# Patient Record
Sex: Female | Born: 1990 | Race: Black or African American | Hispanic: No | Marital: Single | Smoking: Former smoker
Health system: Southern US, Community
[De-identification: ages and names within clinical notes are randomized; demographics above are authoritative.]

## PROBLEM LIST (undated history)

## (undated) DIAGNOSIS — Z789 Other specified health status: Secondary | ICD-10-CM

## (undated) HISTORY — PX: NO PAST SURGERIES: SHX2092

---

## 2015-01-17 ENCOUNTER — Emergency Department (HOSPITAL_COMMUNITY): Payer: No Typology Code available for payment source

## 2015-01-17 ENCOUNTER — Encounter (HOSPITAL_COMMUNITY): Payer: Self-pay | Admitting: *Deleted

## 2015-01-17 ENCOUNTER — Emergency Department (HOSPITAL_COMMUNITY)
Admission: EM | Admit: 2015-01-17 | Discharge: 2015-01-18 | Disposition: A | Discharge status: Home / Self Care | Payer: No Typology Code available for payment source | Attending: Emergency Medicine | Admitting: Emergency Medicine

## 2015-01-17 DIAGNOSIS — Z8781 Personal history of (healed) traumatic fracture: Secondary | ICD-10-CM

## 2015-01-17 DIAGNOSIS — Y9389 Activity, other specified: Secondary | ICD-10-CM | POA: Insufficient documentation

## 2015-01-17 DIAGNOSIS — S52501A Unspecified fracture of the lower end of right radius, initial encounter for closed fracture: Secondary | ICD-10-CM | POA: Diagnosis not present

## 2015-01-17 DIAGNOSIS — Y998 Other external cause status: Secondary | ICD-10-CM | POA: Insufficient documentation

## 2015-01-17 DIAGNOSIS — Y92410 Unspecified street and highway as the place of occurrence of the external cause: Secondary | ICD-10-CM | POA: Diagnosis not present

## 2015-01-17 DIAGNOSIS — S6991XA Unspecified injury of right wrist, hand and finger(s), initial encounter: Secondary | ICD-10-CM | POA: Diagnosis present

## 2015-01-17 DIAGNOSIS — Z720 Tobacco use: Secondary | ICD-10-CM | POA: Diagnosis not present

## 2015-01-17 MED ORDER — OXYCODONE-ACETAMINOPHEN 5-325 MG PO TABS
2.0000 | ORAL_TABLET | Freq: Once | ORAL | Status: AC
  Administered 2015-01-17: 2 via ORAL
  Filled 2015-01-17: qty 2
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
## 2015-01-18 ENCOUNTER — Emergency Department (HOSPITAL_COMMUNITY): Payer: No Typology Code available for payment source

## 2015-01-18 MED ORDER — METHOCARBAMOL 500 MG PO TABS: 500.0000 mg | ORAL_TABLET | Freq: Two times a day (BID) | ORAL | Status: DC

## 2015-01-18 MED ORDER — HYDROCODONE-ACETAMINOPHEN 5-325 MG PO TABS: 1.0000 | ORAL_TABLET | Freq: Four times a day (QID) | ORAL | Status: DC | PRN

## 2015-01-18 MED ORDER — LORAZEPAM 0.5 MG PO TABS
1.0000 mg | ORAL_TABLET | Freq: Once | ORAL | Status: AC
  Administered 2015-01-18: 1 mg via ORAL
  Filled 2015-01-18: qty 2

## 2015-01-18 MED ORDER — LIDOCAINE HCL 2 % IJ SOLN
15.0000 mL | Freq: Once | INTRAMUSCULAR | Status: AC
  Administered 2015-01-18: 300 mg
  Filled 2015-01-18: qty 20

## 2015-01-18 MED ORDER — LIDOCAINE HCL 2 % IJ SOLN: 20.0000 mL | Freq: Once | INTRAMUSCULAR | Status: DC

## 2015-01-18 NOTE — Discharge Instructions (Signed)
1. Medications: vicodin, robaxin, usual home medications 2. Treatment: rest, drink plenty of fluids, keep splint dry, keep arm elevated, take pain medicine as needed, use ice 3. Follow Up: Please followup with Dr. Mina MarbleWeingold when he is able to see you in the office for discussion of your diagnoses and further evaluation after today's visit; if you do not have a primary care doctor use the resource guide provided to find one; Please return to the ER for worsening symptoms    Cast or Splint Care Casts and splints support injured limbs and keep bones from moving while they heal. It is important to care for your cast or splint at home.  HOME CARE INSTRUCTIONS  Keep the cast or splint uncovered during the drying period. It can take 24 to 48 hours to dry if it is made of plaster. A fiberglass cast will dry in less than 1 hour.  Do not rest the cast on anything harder than a pillow for the first 24 hours.  Do not put weight on your injured limb or apply pressure to the cast until your health care provider gives you permission.  Keep the cast or splint dry. Wet casts or splints can lose their shape and may not support the limb as well. A wet cast that has lost its shape can also create harmful pressure on your skin when it dries. Also, wet skin can become infected.  Cover the cast or splint with a plastic bag when bathing or when out in the rain or snow. If the cast is on the trunk of the body, take sponge baths until the cast is removed.  If your cast does become wet, dry it with a towel or a blow dryer on the cool setting only.  Keep your cast or splint clean. Soiled casts may be wiped with a moistened cloth.  Do not place any hard or soft foreign objects under your cast or splint, such as cotton, toilet paper, lotion, or powder.  Do not try to scratch the skin under the cast with any object. The object could get stuck inside the cast. Also, scratching could lead to an infection. If itching is a  problem, use a blow dryer on a cool setting to relieve discomfort.  Do not trim or cut your cast or remove padding from inside of it.  Exercise all joints next to the injury that are not immobilized by the cast or splint. For example, if you have a long leg cast, exercise the hip joint and toes. If you have an arm cast or splint, exercise the shoulder, elbow, thumb, and fingers.  Elevate your injured arm or leg on 1 or 2 pillows for the first 1 to 3 days to decrease swelling and pain.It is best if you can comfortably elevate your cast so it is higher than your heart. SEEK MEDICAL CARE IF:   Your cast or splint cracks.  Your cast or splint is too tight or too loose.  You have unbearable itching inside the cast.  Your cast becomes wet or develops a soft spot or area.  You have a bad smell coming from inside your cast.  You get an object stuck under your cast.  Your skin around the cast becomes red or raw.  You have new pain or worsening pain after the cast has been applied. SEEK IMMEDIATE MEDICAL CARE IF:   You have fluid leaking through the cast.  You are unable to move your fingers or toes.  You  have discolored (blue or white), cool, painful, or very swollen fingers or toes beyond the cast.  You have tingling or numbness around the injured area.  You have severe pain or pressure under the cast.  You have any difficulty with your breathing or have shortness of breath.  You have chest pain. Document Released: 10/28/2000 Document Revised: 08/21/2013 Document Reviewed: 05/09/2013 [facility] Patient Information 2015 [place], [place]. This information is not intended to replace advice given to you by your health care provider. Make sure you discuss any questions you have with your health care provider.

## 2015-12-17 ENCOUNTER — Emergency Department (HOSPITAL_COMMUNITY): Payer: Federal, State, Local not specified - PPO

## 2015-12-17 ENCOUNTER — Encounter (HOSPITAL_COMMUNITY): Payer: Self-pay | Admitting: *Deleted

## 2015-12-17 ENCOUNTER — Emergency Department (HOSPITAL_COMMUNITY)
Admission: EM | Admit: 2015-12-17 | Discharge: 2015-12-17 | Disposition: A | Discharge status: Home / Self Care | Payer: Federal, State, Local not specified - PPO | Attending: Emergency Medicine | Admitting: Emergency Medicine

## 2015-12-17 DIAGNOSIS — R0789 Other chest pain: Secondary | ICD-10-CM | POA: Insufficient documentation

## 2015-12-17 DIAGNOSIS — Z79899 Other long term (current) drug therapy: Secondary | ICD-10-CM | POA: Diagnosis not present

## 2015-12-17 DIAGNOSIS — Z3202 Encounter for pregnancy test, result negative: Secondary | ICD-10-CM | POA: Insufficient documentation

## 2015-12-17 DIAGNOSIS — R51 Headache: Secondary | ICD-10-CM | POA: Insufficient documentation

## 2015-12-17 DIAGNOSIS — R079 Chest pain, unspecified: Secondary | ICD-10-CM | POA: Diagnosis present

## 2015-12-17 DIAGNOSIS — F17210 Nicotine dependence, cigarettes, uncomplicated: Secondary | ICD-10-CM | POA: Diagnosis not present

## 2015-12-17 DIAGNOSIS — R000 Tachycardia, unspecified: Secondary | ICD-10-CM | POA: Insufficient documentation

## 2015-12-17 LAB — I-STAT CHEM 8, ED
BUN: 5 mg/dL — ABNORMAL LOW (ref 6–20)
Calcium, Ion: 1.22 mmol/L (ref 1.12–1.23)
Chloride: 101 mmol/L (ref 101–111)
Creatinine, Ser: 0.8 mg/dL (ref 0.44–1.00)
Glucose, Bld: 91 mg/dL (ref 65–99)
HEMATOCRIT: 43 % (ref 36.0–46.0)
Hemoglobin: 14.6 g/dL (ref 12.0–15.0)
POTASSIUM: 3.4 mmol/L — AB (ref 3.5–5.1)
Sodium: 139 mmol/L (ref 135–145)
TCO2: 24 mmol/L (ref 0–100)

## 2015-12-17 LAB — I-STAT TROPONIN, ED: TROPONIN I, POC: 0 ng/mL (ref 0.00–0.08)

## 2015-12-17 LAB — I-STAT BETA HCG BLOOD, ED (MC, WL, AP ONLY)

## 2015-12-17 MED ORDER — HYDROCODONE-ACETAMINOPHEN 5-325 MG PO TABS
1.0000 | ORAL_TABLET | Freq: Once | ORAL | Status: AC
  Administered 2015-12-17: 1 via ORAL
  Filled 2015-12-17: qty 1

## 2015-12-17 MED ORDER — NAPROXEN 500 MG PO TABS
500.0000 mg | ORAL_TABLET | Freq: Two times a day (BID) | ORAL | Status: DC
Start: 2015-12-17 — End: 2017-02-11

## 2015-12-17 NOTE — ED Notes (Signed)
Patient transported to X-ray 

## 2015-12-17 NOTE — ED Notes (Signed)
Pt c/o left rib pain x 2-3 days.  Pt denies cough/injury.

## 2015-12-17 NOTE — ED Provider Notes (Signed)
CSN: [id number]     Arrival date & time 12/17/15  1759 History  By signing my name below, I, Gonzella Lex, attest that this documentation has been prepared under the direction and in the presence of [employer], PA-C. Electronically Signed: Gonzella Lex, Scribe. 12/17/2015. 6:40 PM.   Chief Complaint  Patient presents with  . Pain    left ribs   The history is provided by the patient. No language interpreter was used.   HPI Comments: Kaitlyn Conrad is a 25 y.o. female who presents to the Emergency Department complaining of sudden onset, constant, stabbing, sore, 6/10 left-sided chest/rib pain which began two nights ago and woke her from her sleep. She notes that the pain is intermittently worse with coughing, respiration and movement. She also notes associated mild HA. Pt had a bladder infection last week which was resolved by drinking cranberry juice. She reports possible pregnancy but is unsure, and states that she is not on any sort of contraceptive currently. She has not taken anything for her pain. Pt reports that she smokes about seven cigarettes a day. Also reports increased consumption of soda and not very much water. Pt denies nausea, vomiting, cough, fever, numbness, weakness, leg swelling, diaphoresis, and itching and tingling to the site. She also denies hx of HTN, hypercholesteremia, and DM as well as a family hx of MI or PE.   History reviewed. No pertinent past medical history. History reviewed. No pertinent past surgical history. No family history on file. Social History  Substance Use Topics  . Smoking status: Current Every Day Smoker    Types: Cigarettes  . Smokeless tobacco: None  . Alcohol Use: Yes     Comment: ocassionally   OB History    No data available     Review of Systems A complete 10 system review of systems was obtained and all systems are negative except as noted in the HPI and PMH.   Allergies  Review of patient's allergies indicates no  known allergies.  Home Medications   Prior to Admission medications   Medication Sig Start Date End Date Taking? Authorizing Provider  HYDROcodone-acetaminophen (NORCO/VICODIN) 5-325 MG per tablet Take 1-2 tablets by mouth every 6 (six) hours as needed for moderate pain or severe pain. 01/18/15   Hannah Muthersbaugh, PA-C  methocarbamol (ROBAXIN) 500 MG tablet Take 1 tablet (500 mg total) by mouth 2 (two) times daily. 01/18/15   Hannah Muthersbaugh, PA-C  naproxen (NAPROSYN) 500 MG tablet Take 1 tablet (500 mg total) by mouth 2 (two) times daily. 12/17/15   Joycie Peek, PA-C   BP 112/67 mmHg  Pulse 85  Temp(Src) 99.1 F (37.3 C) (Oral)  Resp 18  SpO2 100%  LMP 11/22/2015 Physical Exam  Constitutional: She is oriented to person, place, and time. She appears well-developed and well-nourished. No distress.  HENT:  Head: Normocephalic and atraumatic.  Mouth/Throat: Oropharynx is clear and moist.  Eyes: Conjunctivae are normal. Pupils are equal, round, and reactive to light. Right eye exhibits no discharge. Left eye exhibits no discharge. No scleral icterus.  Neck: Normal range of motion. Neck supple.  Cardiovascular: Regular rhythm and normal heart sounds.   Mild Tachycardia  Pulmonary/Chest: Effort normal and breath sounds normal. No respiratory distress. She has no wheezes. She has no rales.  Pain not reproducible on palpitation  Abdominal: Soft. She exhibits no distension. There is no tenderness.  Musculoskeletal: Normal range of motion. She exhibits no edema or tenderness.  No unilateral leg swelling  or skin changes.  Neurological: She is alert and oriented to person, place, and time.  Cranial Nerves II-XII grossly intact  Skin: Skin is warm and dry. No rash noted.  Psychiatric: She has a normal mood and affect.  Nursing note and vitals reviewed.   ED Course  Procedures  DIAGNOSTIC STUDIES:    Oxygen Saturation is 100% on RA, normal by my interpretation.   COORDINATION OF  CARE:  6:15 PM Will order EKG and chest x ray. Will administer pain medication in the ED. Discussed treatment plan with pt at bedside and pt agreed to plan.   Labs Review Labs Reviewed  I-STAT CHEM 8, ED - Abnormal; Notable for the following:    Potassium 3.4 (*)    BUN 5 (*)    All other components within normal limits  I-STAT TROPOININ, ED  I-STAT BETA HCG BLOOD, ED ([facility], [facility], [facility] ONLY)   Imaging Review No results found. I have personally reviewed and evaluated these images and lab results as part of my medical decision-making.   EKG Interpretation   Date/Time:  Thursday December 17 2015 18:48:35 EST Ventricular Rate:  98 PR Interval:  126 QRS Duration: 78 QT Interval:  328 QTC Calculation: 419 R Axis:   79 Text Interpretation:  Sinus rhythm Right atrial enlargement Sinus rhythm  Artifact Abnormal ekg Confirmed by Gerhard Munch  MD [id number]) on 12/17/2015  7:16:48 PM     Meds given in ED:  Medications  HYDROcodone-acetaminophen (NORCO/VICODIN) 5-325 MG per tablet 1 tablet (1 tablet Oral Given 12/17/15 1854)    Discharge Medication List as of 12/17/2015  7:48 PM    START taking these medications   Details  naproxen (NAPROSYN) 500 MG tablet Take 1 tablet (500 mg total) by mouth 2 (two) times daily., Starting 12/17/2015, Until Discontinued, Print       Filed Vitals:   12/17/15 1807 12/17/15 2004  BP: 102/77 112/67  Pulse: 110 85  Temp: 99.5 F (37.5 C) 99.1 F (37.3 C)  TempSrc: Oral Oral  Resp: 18 18  SpO2:  100%    MDM  Vitals stable -afebrile Pt resting comfortably in ED. discomfort is improving after oral analgesia PE--Lung exam unremarkable. Cardiac auscultation reveals no murmurs rubs or gallops. Grossly Benign Physical Exam Labwork: Troponin negative. EKG reassuring. Labs otherwise noncontributory Imaging: CXR shows No acute abnormality  DDX: Patient with atypical chest pain ongoing constantly for the past 2 days. Clinical picture and exam today not  consistent with ACS/dissection. Heart score 2. No evidence of spontaneous pneumothorax, esophageal rupture or other mediastinitis. Patient is mildly tachycardic on arrival, 98 bpm on EKG, per well's score she is low risk. Without shortness of breath, cough, hypoxia, low suspicion for PE. Suspect mild tachycardia secondary to pain versus dehydration component. No evidence of infection. No evidence of myocarditis, endocarditis, pericarditis.  Will DC with trial of NSAIDs. I discussed all relevant lab findings and imaging results with pt and they verbalized understanding. Discussed f/u with PCP within 48 hrs and return precautions, pt very amenable to plan.   Final diagnoses:  Atypical chest pain    I personally performed the services described in this documentation, which was scribed in my presence. The recorded information has been reviewed and is accurate.    Joycie Peek, PA-C 12/21/15 0945  Gerhard Munch, MD 12/22/15 [phone]

## 2015-12-17 NOTE — Discharge Instructions (Signed)
There does not appear to be an emergent cause her chest pain at this time. Your exam, EKG, chest x-ray and labs were all reassuring. Please follow-up with your doctor next week or use the attached resource guide to help find a Dr. Return to ED for any new or worsening symptoms as we discussed  Nonspecific Chest Pain  Chest pain can be caused by many different conditions. There is always a chance that your pain could be related to something serious, such as a heart attack or a blood clot in your lungs. Chest pain can also be caused by conditions that are not life-threatening. If you have chest pain, it is very important to follow up with your health care provider. CAUSES  Chest pain can be caused by:  Heartburn.  Pneumonia or bronchitis.  Anxiety or stress.  Inflammation around your heart (pericarditis) or lung (pleuritis or pleurisy).  A blood clot in your lung.  A collapsed lung (pneumothorax). It can develop suddenly on its own (spontaneous pneumothorax) or from trauma to the chest.  Shingles infection (varicella-zoster virus).  Heart attack.  Damage to the bones, muscles, and cartilage that make up your chest wall. This can include:  Bruised bones due to injury.  Strained muscles or cartilage due to frequent or repeated coughing or overwork.  Fracture to one or more ribs.  Sore cartilage due to inflammation (costochondritis). RISK FACTORS  Risk factors for chest pain may include:  Activities that increase your risk for trauma or injury to your chest.  Respiratory infections or conditions that cause frequent coughing.  Medical conditions or overeating that can cause heartburn.  Heart disease or family history of heart disease.  Conditions or health behaviors that increase your risk of developing a blood clot.  Having had chicken pox (varicella zoster). SIGNS AND SYMPTOMS Chest pain can feel like:  Burning or tingling on the surface of your chest or deep in your  chest.  Crushing, pressure, aching, or squeezing pain.  Dull or sharp pain that is worse when you move, cough, or take a deep breath.  Pain that is also felt in your back, neck, shoulder, or arm, or pain that spreads to any of these areas. Your chest pain may come and go, or it may stay constant. DIAGNOSIS Lab tests or other studies may be needed to find the cause of your pain. Your health care provider may have you take a test called an ambulatory ECG (electrocardiogram). An ECG records your heartbeat patterns at the time the test is performed. You may also have other tests, such as:  Transthoracic echocardiogram (TTE). During echocardiography, sound waves are used to create a picture of all of the heart structures and to look at how blood flows through your heart.  Transesophageal echocardiogram (TEE).This is a more advanced imaging test that obtains images from inside your body. It allows your health care provider to see your heart in finer detail.  Cardiac monitoring. This allows your health care provider to monitor your heart rate and rhythm in real time.  Holter monitor. This is a portable device that records your heartbeat and can help to diagnose abnormal heartbeats. It allows your health care provider to track your heart activity for several days, if needed.  Stress tests. These can be done through exercise or by taking medicine that makes your heart beat more quickly.  Blood tests.  Imaging tests. TREATMENT  Your treatment depends on what is causing your chest pain. Treatment may include:  Medicines.  These may include: °¨ Acid blockers for heartburn. °¨ Anti-inflammatory medicine. °¨ Pain medicine for inflammatory conditions. °¨ Antibiotic medicine, if an infection is present. °¨ Medicines to dissolve blood clots. °¨ Medicines to treat coronary artery disease. °· Supportive care for conditions that do not require medicines. This may include: °¨ Resting. °¨ Applying heat or cold  packs to injured areas. °¨ Limiting activities until pain decreases. °HOME CARE INSTRUCTIONS °· If you were prescribed an antibiotic medicine, finish it all even if you start to feel better. °· Avoid any activities that bring on chest pain. °· Do not use any tobacco products, including cigarettes, chewing tobacco, or electronic cigarettes. If you need help quitting, ask your health care provider. °· Do not drink alcohol. °· Take medicines only as directed by your health care provider. °· Keep all follow-up visits as directed by your health care provider. This is important. This includes any further testing if your chest pain does not go away. °· If heartburn is the cause for your chest pain, you may be told to keep your head raised (elevated) while sleeping. This reduces the chance that acid will go from your stomach into your esophagus. °· Make lifestyle changes as directed by your health care provider. These may include: °¨ Getting regular exercise. Ask your health care provider to suggest some activities that are safe for you. °¨ Eating a heart-healthy diet. A registered dietitian can help you to learn healthy eating options. °¨ Maintaining a healthy weight. °¨ Managing diabetes, if necessary. °¨ Reducing stress. °SEEK MEDICAL CARE IF: °· Your chest pain does not go away after treatment. °· You have a rash with blisters on your chest. °· You have a fever. °SEEK IMMEDIATE MEDICAL CARE IF:  °· Your chest pain is worse. °· You have an increasing cough, or you cough up blood. °· You have severe abdominal pain. °· You have severe weakness. °· You faint. °· You have chills. °· You have sudden, unexplained chest discomfort. °· You have sudden, unexplained discomfort in your arms, back, neck, or jaw. °· You have shortness of breath at any time. °· You suddenly start to sweat, or your skin gets clammy. °· You feel nauseous or you vomit. °· You suddenly feel light-headed or dizzy. °· Your heart begins to beat quickly, or  it feels like it is skipping beats. °These symptoms may represent a serious problem that is an emergency. Do not wait to see if the symptoms will go away. Get medical help right away. Call your local emergency services (911 in the [place] Do not drive yourself to the hospital. °  °This information is not intended to replace advice given to you by your health care provider. Make sure you discuss any questions you have with your health care provider. °  °Document Released: 08/10/2005 Document Revised: 11/21/2014 Document Reviewed: 06/06/2014 °Elsevier Interactive Patient Education ©2016 [employer] ° °

## 2015-12-24 ENCOUNTER — Emergency Department (HOSPITAL_COMMUNITY): Payer: Federal, State, Local not specified - PPO

## 2015-12-24 ENCOUNTER — Encounter (HOSPITAL_COMMUNITY): Payer: Self-pay

## 2015-12-24 ENCOUNTER — Emergency Department (HOSPITAL_COMMUNITY)
Admission: EM | Admit: 2015-12-24 | Discharge: 2015-12-24 | Disposition: A | Discharge status: Home / Self Care | Payer: Federal, State, Local not specified - PPO | Attending: Emergency Medicine | Admitting: Emergency Medicine

## 2015-12-24 DIAGNOSIS — N390 Urinary tract infection, site not specified: Secondary | ICD-10-CM

## 2015-12-24 DIAGNOSIS — Z79899 Other long term (current) drug therapy: Secondary | ICD-10-CM | POA: Insufficient documentation

## 2015-12-24 DIAGNOSIS — Z791 Long term (current) use of non-steroidal anti-inflammatories (NSAID): Secondary | ICD-10-CM | POA: Insufficient documentation

## 2015-12-24 DIAGNOSIS — R05 Cough: Secondary | ICD-10-CM | POA: Insufficient documentation

## 2015-12-24 DIAGNOSIS — F17210 Nicotine dependence, cigarettes, uncomplicated: Secondary | ICD-10-CM | POA: Diagnosis not present

## 2015-12-24 DIAGNOSIS — R509 Fever, unspecified: Secondary | ICD-10-CM | POA: Diagnosis present

## 2015-12-24 LAB — URINE MICROSCOPIC-ADD ON: RBC / HPF: NONE SEEN RBC/hpf (ref 0–5)

## 2015-12-24 LAB — URINALYSIS, ROUTINE W REFLEX MICROSCOPIC
Bilirubin Urine: NEGATIVE
Glucose, UA: NEGATIVE mg/dL
Hgb urine dipstick: NEGATIVE
Ketones, ur: 15 mg/dL — AB
NITRITE: POSITIVE — AB
PH: 7 (ref 5.0–8.0)
Protein, ur: NEGATIVE mg/dL
SPECIFIC GRAVITY, URINE: 1.021 (ref 1.005–1.030)

## 2015-12-24 MED ORDER — CEPHALEXIN 500 MG PO CAPS: 500.0000 mg | ORAL_CAPSULE | Freq: Two times a day (BID) | ORAL | Status: AC

## 2015-12-24 MED ORDER — ACETAMINOPHEN 325 MG PO TABS
650.0000 mg | ORAL_TABLET | Freq: Once | ORAL | Status: AC
  Administered 2015-12-24: 650 mg via ORAL

## 2015-12-24 MED ORDER — ACETAMINOPHEN 325 MG PO TABS
ORAL_TABLET | ORAL | Status: AC
  Filled 2015-12-24: qty 2

## 2015-12-24 MED ORDER — CEPHALEXIN 250 MG PO CAPS
500.0000 mg | ORAL_CAPSULE | Freq: Once | ORAL | Status: AC
  Administered 2015-12-24: 500 mg via ORAL
  Filled 2015-12-24: qty 2

## 2015-12-24 NOTE — ED Provider Notes (Signed)
CSN: [id number]     Arrival date & time 12/24/15  1115 History   First MD Initiated Contact with Patient 12/24/15 1252     Chief Complaint  Patient presents with  . Cough  . Fever     (Consider location/radiation/quality/duration/timing/severity/associated sxs/prior Treatment) HPI   89 y f w no sig PMH who comes in with 1 week of sx.  For the past week she has had fever that has been daily up to 102.  approx one week ago she has sharp L sided rib pain that has since resolved.  She has been having cough for the past 2-3 days which has been mildly productive.     History reviewed. No pertinent past medical history. History reviewed. No pertinent past surgical history. No family history on file. Social History  Substance Use Topics  . Smoking status: Current Every Day Smoker    Types: Cigarettes  . Smokeless tobacco: None  . Alcohol Use: Yes     Comment: ocassionally   OB History    No data available     Review of Systems  Constitutional: Positive for fever. Negative for chills.  HENT: Negative for nosebleeds.   Eyes: Negative for visual disturbance.  Respiratory: Positive for cough. Negative for shortness of breath.   Cardiovascular: Negative for chest pain.  Gastrointestinal: Negative for nausea, vomiting, abdominal pain, diarrhea and constipation.  Genitourinary: Negative for dysuria.  Skin: Negative for rash.  Neurological: Negative for weakness.  All other systems reviewed and are negative.     Allergies  Review of patient's allergies indicates no known allergies.  Home Medications   Prior to Admission medications   Medication Sig Start Date End Date Taking? Authorizing Provider  cephALEXin (KEFLEX) 500 MG capsule Take 1 capsule (500 mg total) by mouth 2 (two) times daily. 12/24/15 12/30/15  Silas Flood, MD  HYDROcodone-acetaminophen (NORCO/VICODIN) 5-325 MG per tablet Take 1-2 tablets by mouth every 6 (six) hours as needed for moderate pain or severe pain. 01/18/15    Hannah Muthersbaugh, PA-C  methocarbamol (ROBAXIN) 500 MG tablet Take 1 tablet (500 mg total) by mouth 2 (two) times daily. 01/18/15   Hannah Muthersbaugh, PA-C  naproxen (NAPROSYN) 500 MG tablet Take 1 tablet (500 mg total) by mouth 2 (two) times daily. 12/17/15   Joycie Peek, PA-C   BP 110/68 mmHg  Pulse 82  Temp(Src) 98.2 F (36.8 C) (Oral)  Resp 20  SpO2 100%  LMP 11/22/2015 Physical Exam  Constitutional: She is oriented to person, place, and time. No distress.  HENT:  Head: Normocephalic and atraumatic.  Eyes: EOM are normal. Pupils are equal, round, and reactive to light.  Neck: Normal range of motion. Neck supple.  Cardiovascular: Intact distal pulses.   Pulmonary/Chest: Effort normal and breath sounds normal. No respiratory distress.  Abdominal: Soft. There is no tenderness. There is no rebound and no guarding.  Musculoskeletal: Normal range of motion.  Neurological: She is alert and oriented to person, place, and time.  Skin: No rash noted. She is not diaphoretic.  Psychiatric: She has a normal mood and affect.    ED Course  Procedures (including critical care time) Labs Review Labs Reviewed  URINALYSIS, ROUTINE W REFLEX MICROSCOPIC (NOT AT [facility]) - Abnormal; Notable for the following:    APPearance CLOUDY (*)    Ketones, ur 15 (*)    Nitrite POSITIVE (*)    Leukocytes, UA SMALL (*)    All other components within normal limits  URINE MICROSCOPIC-ADD ON -  Abnormal; Notable for the following:    Squamous Epithelial / LPF 6-30 (*)    Bacteria, UA MANY (*)    All other components within normal limits  URINE CULTURE    Imaging Review Dg Chest 2 View  12/24/2015  CLINICAL DATA:  One week history of cough and fever EXAM: CHEST  2 VIEW COMPARISON:  December 17, 2015 FINDINGS: Lungs are clear. Heart size and pulmonary vascularity are normal. No adenopathy. No bone lesions. IMPRESSION: No edema or consolidation. Electronically Signed   By: Bretta Bang III M.D.   On:  12/24/2015 13:53   I have personally reviewed and evaluated these images and lab results as part of my medical decision-making.   EKG Interpretation None      MDM   Final diagnoses:  UTI (lower urinary tract infection)    24 y f w no sig PMH who comes in with 1 week of sx.  On 2/7 she was seen for L sided rib pain and at that time cxr/ekg unremarkable.  Chem 8 unremarkable and trop unremarkable.  Her rib pain resolved but she developed fever and cough since then with no other sx  Exam as above.  Lungs CTAB.  intiially tachy but febrile.  Repeat HR after fever broke is better.  cxr obtained and without infiltrate.  Obtained ua to look for other sources of fever and is + leuks, + nitrite.  Will treat for uti and have her f/u with pcp  I have discussed the results, Dx and Tx plan with the pt. They expressed understanding and agree with the plan and were told to return to ED with any worsening of condition or concern.    Disposition: Discharge  Condition: Good  Discharge Medication List as of 12/24/2015  3:00 PM    START taking these medications   Details  cephALEXin (KEFLEX) 500 MG capsule Take 1 capsule (500 mg total) by mouth 2 (two) times daily., Starting 12/24/2015, Until Wed 12/30/15, Print        Follow Up: [facility] [address] [place] [address] [phone] In 2 days follow up with primary care   Pt seen in conjunction with Dr. Harle Stanford, MD 12/24/15 1513  Tilden Fossa, MD 12/25/15 [phone]

## 2015-12-24 NOTE — Discharge Instructions (Signed)

## 2015-12-24 NOTE — ED Notes (Signed)
MD at the bedside  

## 2015-12-24 NOTE — ED Notes (Signed)
Patient here with cough and fever x 1 week, seen at [facility] last week and treated with anti-infammatory, now ongoing fever and cough

## 2015-12-26 LAB — URINE CULTURE

## 2015-12-27 ENCOUNTER — Telehealth (HOSPITAL_BASED_OUTPATIENT_CLINIC_OR_DEPARTMENT_OTHER): Payer: Self-pay | Admitting: Emergency Medicine

## 2015-12-27 NOTE — Telephone Encounter (Signed)
Post ED Visit - Positive Culture Follow-up  Culture report reviewed by antimicrobial stewardship pharmacist:   Enzo Bi, Pharm.D.  Celedonio Miyamoto, Pharm.D., BCPS  Garvin Fila, Pharm.D.  Georgina Pillion, Pharm.D., BCPS  [place], [address].D., BCPS, AAHIVP  Estella Husk, Pharm.D., BCPS, AAHIVP  Tennis Must, Pharm.D.  Sherle Poe, [place].D.  Positive urine culture E. coli Treated with cephalexin, organism sensitive to the same and no further patient follow-up is required at this time.  Berle Mull 12/27/2015, 11:14 AM
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
## 2016-11-14 NOTE — L&D Delivery Note (Signed)
Delivery Note At 1:35 AM a viable and healthy female was delivered via Vaginal, Spontaneous Delivery (Presentation: LOA ).  APGAR: 8, 9; weight  Pending. Placenta status: spontaneous, intact.  Cord:  with the following complications: none.  Cord pH: na  Anesthesia:  epidural Episiotomy:  na Lacerations: 2nd degree;Perineal Suture Repair: 2.0 vicryl rapide Est. Blood Loss (mL): 100  Mom to postpartum.  Baby to Couplet care / Skin to Skin.  Tigerlily Christine J 02/12/2017, 1:51 AM

## 2017-02-08 ENCOUNTER — Other Ambulatory Visit: Payer: Self-pay | Admitting: Obstetrics & Gynecology

## 2017-02-11 ENCOUNTER — Encounter (HOSPITAL_COMMUNITY): Payer: Self-pay

## 2017-02-11 ENCOUNTER — Inpatient Hospital Stay (HOSPITAL_COMMUNITY)
Admission: AD | Admit: 2017-02-11 | Discharge: 2017-02-13 | DRG: 775 | Disposition: A | Discharge status: Home / Self Care | Payer: Federal, State, Local not specified - PPO | Source: Ambulatory Visit | Attending: Obstetrics and Gynecology | Admitting: Obstetrics and Gynecology

## 2017-02-11 ENCOUNTER — Inpatient Hospital Stay (HOSPITAL_COMMUNITY)
Admission: AD | Admit: 2017-02-11 | Discharge: 2017-02-11 | Disposition: A | Discharge status: Home / Self Care | Payer: Federal, State, Local not specified - PPO | Source: Ambulatory Visit | Attending: Obstetrics | Admitting: Obstetrics

## 2017-02-11 DIAGNOSIS — Z3A40 40 weeks gestation of pregnancy: Secondary | ICD-10-CM

## 2017-02-11 DIAGNOSIS — Z8249 Family history of ischemic heart disease and other diseases of the circulatory system: Secondary | ICD-10-CM

## 2017-02-11 DIAGNOSIS — O9081 Anemia of the puerperium: Secondary | ICD-10-CM | POA: Diagnosis not present

## 2017-02-11 DIAGNOSIS — Z87891 Personal history of nicotine dependence: Secondary | ICD-10-CM

## 2017-02-11 DIAGNOSIS — O9902 Anemia complicating childbirth: Secondary | ICD-10-CM | POA: Diagnosis present

## 2017-02-11 DIAGNOSIS — Z3493 Encounter for supervision of normal pregnancy, unspecified, third trimester: Secondary | ICD-10-CM | POA: Diagnosis present

## 2017-02-11 DIAGNOSIS — D649 Anemia, unspecified: Secondary | ICD-10-CM | POA: Diagnosis not present

## 2017-02-11 DIAGNOSIS — O479 False labor, unspecified: Secondary | ICD-10-CM

## 2017-02-11 DIAGNOSIS — O471 False labor at or after 37 completed weeks of gestation: Secondary | ICD-10-CM

## 2017-02-11 HISTORY — DX: Other specified health status: Z78.9

## 2017-02-11 LAB — OB RESULTS CONSOLE GBS: STREP GROUP B AG: NEGATIVE

## 2017-02-11 LAB — CBC
HCT: 32.6 % — ABNORMAL LOW (ref 36.0–46.0)
Hemoglobin: 10.8 g/dL — ABNORMAL LOW (ref 12.0–15.0)
MCH: 27.5 pg (ref 26.0–34.0)
MCHC: 33.1 g/dL (ref 30.0–36.0)
MCV: 83 fL (ref 78.0–100.0)
PLATELETS: 302 10*3/uL (ref 150–400)
RBC: 3.93 MIL/uL (ref 3.87–5.11)
RDW: 14.7 % (ref 11.5–15.5)
WBC: 9 10*3/uL (ref 4.0–10.5)

## 2017-02-11 MED ORDER — EPHEDRINE 5 MG/ML INJ
10.0000 mg | INTRAVENOUS | Status: DC | PRN
  Filled 2017-02-11: qty 2

## 2017-02-11 MED ORDER — LIDOCAINE HCL (PF) 1 % IJ SOLN
30.0000 mL | INTRAMUSCULAR | Status: DC | PRN
  Filled 2017-02-11: qty 30

## 2017-02-11 MED ORDER — FENTANYL 2.5 MCG/ML BUPIVACAINE 1/10 % EPIDURAL INFUSION (WH - ANES)
14.0000 mL/h | INTRAMUSCULAR | Status: DC | PRN
Start: 2017-02-11 — End: 2017-02-12
  Administered 2017-02-12: 15 mL/h via EPIDURAL
  Filled 2017-02-11: qty 100

## 2017-02-11 MED ORDER — TERBUTALINE SULFATE 1 MG/ML IJ SOLN
0.2500 mg | Freq: Once | INTRAMUSCULAR | Status: DC | PRN
  Filled 2017-02-11: qty 1

## 2017-02-11 MED ORDER — OXYTOCIN BOLUS FROM INFUSION
500.0000 mL | Freq: Once | INTRAVENOUS | Status: AC
  Administered 2017-02-12: 500 mL via INTRAVENOUS

## 2017-02-11 MED ORDER — ACETAMINOPHEN 325 MG PO TABS: 650.0000 mg | ORAL_TABLET | ORAL | Status: DC | PRN

## 2017-02-11 MED ORDER — OXYCODONE-ACETAMINOPHEN 5-325 MG PO TABS: 2.0000 | ORAL_TABLET | ORAL | Status: DC | PRN

## 2017-02-11 MED ORDER — LACTATED RINGERS IV SOLN: 500.0000 mL | Freq: Once | INTRAVENOUS | Status: DC

## 2017-02-11 MED ORDER — DIPHENHYDRAMINE HCL 50 MG/ML IJ SOLN
12.5000 mg | INTRAMUSCULAR | Status: DC | PRN
Start: 2017-02-11 — End: 2017-02-12

## 2017-02-11 MED ORDER — PHENYLEPHRINE 40 MCG/ML (10ML) SYRINGE FOR IV PUSH (FOR BLOOD PRESSURE SUPPORT)
80.0000 ug | PREFILLED_SYRINGE | INTRAVENOUS | Status: DC | PRN
  Filled 2017-02-11: qty 5

## 2017-02-11 MED ORDER — LACTATED RINGERS IV SOLN: INTRAVENOUS | Status: DC

## 2017-02-11 MED ORDER — OXYTOCIN 40 UNITS IN LACTATED RINGERS INFUSION - SIMPLE MED: 1.0000 m[IU]/min | INTRAVENOUS | Status: DC

## 2017-02-11 MED ORDER — PHENYLEPHRINE 40 MCG/ML (10ML) SYRINGE FOR IV PUSH (FOR BLOOD PRESSURE SUPPORT)
80.0000 ug | PREFILLED_SYRINGE | INTRAVENOUS | Status: DC | PRN
  Filled 2017-02-11: qty 10
  Filled 2017-02-11: qty 5

## 2017-02-11 MED ORDER — SOD CITRATE-CITRIC ACID 500-334 MG/5ML PO SOLN: 30.0000 mL | ORAL | Status: DC | PRN

## 2017-02-11 MED ORDER — FLEET ENEMA 7-19 GM/118ML RE ENEM: 1.0000 | ENEMA | RECTAL | Status: DC | PRN

## 2017-02-11 MED ORDER — ONDANSETRON HCL 4 MG/2ML IJ SOLN: 4.0000 mg | Freq: Four times a day (QID) | INTRAMUSCULAR | Status: DC | PRN

## 2017-02-11 MED ORDER — PROMETHAZINE HCL 25 MG/ML IJ SOLN
12.5000 mg | Freq: Once | INTRAMUSCULAR | Status: AC
  Administered 2017-02-11: 12.5 mg via INTRAMUSCULAR
  Filled 2017-02-11: qty 1

## 2017-02-11 MED ORDER — MORPHINE SULFATE (PF) 4 MG/ML IV SOLN
4.0000 mg | Freq: Once | INTRAVENOUS | Status: AC
  Administered 2017-02-11: 4 mg via INTRAMUSCULAR
  Filled 2017-02-11: qty 1

## 2017-02-11 MED ORDER — LACTATED RINGERS IV SOLN: 500.0000 mL | INTRAVENOUS | Status: DC | PRN

## 2017-02-11 MED ORDER — OXYTOCIN 40 UNITS IN LACTATED RINGERS INFUSION - SIMPLE MED
2.5000 [IU]/h | INTRAVENOUS | Status: DC
  Administered 2017-02-12: 2.5 [IU]/h via INTRAVENOUS
  Filled 2017-02-11: qty 1000

## 2017-02-11 MED ORDER — OXYCODONE-ACETAMINOPHEN 5-325 MG PO TABS: 1.0000 | ORAL_TABLET | ORAL | Status: DC | PRN

## 2017-02-11 NOTE — MAU Note (Signed)
Was seen here earlier today for labor eval. 2cm then. Ctxs closer and stronger. Denies LOF or bleeding

## 2017-02-11 NOTE — MAU Provider Note (Signed)
Presented for labor evaluation. No LOF. Good FM. No bloody show. BP 134/73 (BP Location: Left Arm)   Pulse 68   Temp 98.4 F (36.9 C)   Resp 18   Ht  (1.803 m)   Wt 84.4 kg (186 lb)   BMI 25.94 kg/m   VE with minimal change per RN Category 1 tracing Irregular contractions. Prodromal labor pattern RN instructed to recheck in 2 hours and dc home.

## 2017-02-11 NOTE — MAU Note (Signed)
I have communicated with Dr. Ernestina Penna and reviewed vital signs:  Vitals:   02/11/17 1021  BP: 125/87  Pulse: 81  Resp: 18  Temp: 98.5 F (36.9 C)    Vaginal exam:  Dilation: 2 Effacement (%): 50 Cervical Position: Middle Station: -3 Exam by:: Sheryn Bison RN,   Also reviewed contraction pattern and that non-stress test is reactive.  It has been documented that patient is contracting every 2-6 minutes with minimal cervical change over since last exam on Tuesday  not indicating active labor.  Patient denies any other complaints.  Based on this report provider has given order for discharge.  A discharge order and diagnosis entered by a provider.   Labor discharge instructions reviewed with patient.

## 2017-02-11 NOTE — MAU Note (Signed)
Pt states she lost her mucous plug around 0530 this am and was having contractions every five minutes for some time but they have spaced to every twenty minutes now.

## 2017-02-11 NOTE — H&P (Signed)
CC: labor  HPI: lost mucous plug, ctx q min. Good FM, no LOF  PE: 2/50% per RN  Vitals:   02/11/17 1011 02/11/17 1021 02/11/17 1100  BP:  125/87 128/76  Pulse:  81 79  Resp:  18   Temp:  98.5 F (36.9 C)   TempSrc:  Oral   SpO2:  100% 99%  Weight: 84.4 kg (186 lb)    Height: 5' 11.75" (1.822 m)      NST: 120s, + accels, no decels, 10 beat var toco: q 5 min  A/P: Latent labor D/c home Reactive fetal testing  Arris Meyn A. 02/11/2017 11:40 AM

## 2017-02-12 ENCOUNTER — Inpatient Hospital Stay (HOSPITAL_COMMUNITY): Payer: Federal, State, Local not specified - PPO | Admitting: Anesthesiology

## 2017-02-12 ENCOUNTER — Encounter (HOSPITAL_COMMUNITY): Payer: Self-pay

## 2017-02-12 LAB — TYPE AND SCREEN
ABO/RH(D): B POS
ANTIBODY SCREEN: NEGATIVE

## 2017-02-12 LAB — CBC
HEMATOCRIT: 27.3 % — AB (ref 36.0–46.0)
HEMOGLOBIN: 8.9 g/dL — AB (ref 12.0–15.0)
MCH: 27.1 pg (ref 26.0–34.0)
MCHC: 32.6 g/dL (ref 30.0–36.0)
MCV: 83 fL (ref 78.0–100.0)
Platelets: 256 10*3/uL (ref 150–400)
RBC: 3.29 MIL/uL — ABNORMAL LOW (ref 3.87–5.11)
RDW: 14.7 % (ref 11.5–15.5)
WBC: 12 10*3/uL — AB (ref 4.0–10.5)

## 2017-02-12 LAB — RPR: RPR Ser Ql: NONREACTIVE

## 2017-02-12 LAB — ABO/RH: ABO/RH(D): B POS

## 2017-02-12 MED ORDER — PRENATAL MULTIVITAMIN CH
1.0000 | ORAL_TABLET | Freq: Every day | ORAL | Status: DC
  Administered 2017-02-12 – 2017-02-13 (×2): 1 via ORAL
  Filled 2017-02-12 (×2): qty 1

## 2017-02-12 MED ORDER — ZOLPIDEM TARTRATE 5 MG PO TABS: 5.0000 mg | ORAL_TABLET | Freq: Every evening | ORAL | Status: DC | PRN

## 2017-02-12 MED ORDER — ONDANSETRON HCL 4 MG/2ML IJ SOLN: 4.0000 mg | INTRAMUSCULAR | Status: DC | PRN

## 2017-02-12 MED ORDER — SIMETHICONE 80 MG PO CHEW: 80.0000 mg | CHEWABLE_TABLET | ORAL | Status: DC | PRN

## 2017-02-12 MED ORDER — BENZOCAINE-MENTHOL 20-0.5 % EX AERO
1.0000 "application " | INHALATION_SPRAY | CUTANEOUS | Status: DC | PRN
  Administered 2017-02-12: 1 via TOPICAL
  Filled 2017-02-12: qty 56

## 2017-02-12 MED ORDER — TETANUS-DIPHTH-ACELL PERTUSSIS 5-2.5-18.5 LF-MCG/0.5 IM SUSP: 0.5000 mL | Freq: Once | INTRAMUSCULAR | Status: DC

## 2017-02-12 MED ORDER — ONDANSETRON HCL 4 MG PO TABS: 4.0000 mg | ORAL_TABLET | ORAL | Status: DC | PRN

## 2017-02-12 MED ORDER — COCONUT OIL OIL
1.0000 | TOPICAL_OIL | Status: DC | PRN
Start: 2017-02-12 — End: 2017-02-13
  Administered 2017-02-13: 1 via TOPICAL
  Filled 2017-02-12: qty 120

## 2017-02-12 MED ORDER — OXYCODONE-ACETAMINOPHEN 5-325 MG PO TABS: 2.0000 | ORAL_TABLET | ORAL | Status: DC | PRN

## 2017-02-12 MED ORDER — METHYLERGONOVINE MALEATE 0.2 MG/ML IJ SOLN: 0.2000 mg | INTRAMUSCULAR | Status: DC | PRN

## 2017-02-12 MED ORDER — WITCH HAZEL-GLYCERIN EX PADS: 1.0000 "application " | MEDICATED_PAD | CUTANEOUS | Status: DC | PRN

## 2017-02-12 MED ORDER — LIDOCAINE HCL (PF) 1 % IJ SOLN
INTRAMUSCULAR | Status: DC | PRN
  Administered 2017-02-12: 5 mL via EPIDURAL
  Administered 2017-02-12: 4 mL via EPIDURAL

## 2017-02-12 MED ORDER — METHYLERGONOVINE MALEATE 0.2 MG PO TABS: 0.2000 mg | ORAL_TABLET | ORAL | Status: DC | PRN

## 2017-02-12 MED ORDER — ACETAMINOPHEN 325 MG PO TABS: 650.0000 mg | ORAL_TABLET | ORAL | Status: DC | PRN

## 2017-02-12 MED ORDER — DIBUCAINE 1 % RE OINT: 1.0000 "application " | TOPICAL_OINTMENT | RECTAL | Status: DC | PRN

## 2017-02-12 MED ORDER — SENNOSIDES-DOCUSATE SODIUM 8.6-50 MG PO TABS
2.0000 | ORAL_TABLET | ORAL | Status: DC
  Administered 2017-02-13: 2 via ORAL
  Filled 2017-02-12: qty 2

## 2017-02-12 MED ORDER — OXYCODONE-ACETAMINOPHEN 5-325 MG PO TABS
1.0000 | ORAL_TABLET | ORAL | Status: DC | PRN
  Administered 2017-02-12 – 2017-02-13 (×3): 1 via ORAL
  Filled 2017-02-12 (×3): qty 1

## 2017-02-12 MED ORDER — DIPHENHYDRAMINE HCL 25 MG PO CAPS: 25.0000 mg | ORAL_CAPSULE | Freq: Four times a day (QID) | ORAL | Status: DC | PRN

## 2017-02-12 MED ORDER — IBUPROFEN 600 MG PO TABS
600.0000 mg | ORAL_TABLET | Freq: Four times a day (QID) | ORAL | Status: DC
  Administered 2017-02-12 – 2017-02-13 (×7): 600 mg via ORAL
  Filled 2017-02-12 (×7): qty 1

## 2017-02-12 NOTE — H&P (Signed)
Kaitlyn Conrad is a 26 y.o. female presenting for labor. OB History    Gravida Para Term Preterm AB Living   2       1     SAB TAB Ectopic Multiple Live Births     1           Past Medical History:  Diagnosis Date  . Medical history non-contributory    Past Surgical History:  Procedure Laterality Date  . NO PAST SURGERIES     Family History: family history includes Asthma in her sister; Hypertension in her father and mother. Social History:  reports that she quit smoking about 8 months ago. Her smoking use included Cigarettes. She has never used smokeless tobacco. She reports that she does not drink alcohol or use drugs.     Maternal Diabetes: No Genetic Screening: Normal Maternal Ultrasounds/Referrals: Normal Fetal Ultrasounds or other Referrals:  None Maternal Substance Abuse:  No Significant Maternal Medications:  None Significant Maternal Lab Results:  None Other Comments:  None  Review of Systems  Constitutional: Negative.   All other systems reviewed and are negative.  Maternal Medical History:  Reason for admission: Contractions.   Contractions: Onset was 6-12 hours ago.   Frequency: regular.    Fetal activity: Perceived fetal activity is normal.   Last perceived fetal movement was within the past hour.    Prenatal complications: no prenatal complications Prenatal Complications - Diabetes: none.    Dilation: 10 Effacement (%): 100 Station: 0, +1 Exam by:: Abran Cantor, RN Blood pressure (!) 148/86, pulse 84, temperature 98.4 F (36.9 C), resp. rate 18, height  (1.803 m), weight 84.4 kg (186 lb), SpO2 99 %. Maternal Exam:  Uterine Assessment: Contraction strength is moderate.  Contraction frequency is irregular.   Introitus: Normal vulva. Normal vagina.  Ferning test: not done.  Nitrazine test: not done.  Pelvis: questionable for delivery.      Physical Exam  Nursing note and vitals reviewed. Constitutional: She is oriented to person, place,  and time. She appears well-developed and well-nourished.  HENT:  Head: Normocephalic and atraumatic.  Neck: Normal range of motion. Neck supple.  Cardiovascular: Normal rate and regular rhythm.   Respiratory: Effort normal and breath sounds normal.  GI: Soft. Bowel sounds are normal.  Genitourinary: Vagina normal and uterus normal.  Musculoskeletal: Normal range of motion.  Neurological: She is alert and oriented to person, place, and time. She has normal reflexes. No cranial nerve deficit.  Skin: Skin is warm and dry.  Psychiatric: She has a normal mood and affect.    Prenatal labs: ABO, Rh: --/--/B POS (03/31 2353) Antibody: NEG (03/31 2353) Rubella:   RPR:    HBsAg:    HIV:    GBS: Negative (03/31 0000)   Assessment/Plan: Term IUP Active labor Admit   Ian Castagna J 02/12/2017, 1:49 AM

## 2017-02-12 NOTE — Progress Notes (Signed)
PPD 0 SVD  S:  Reports feeling well             Tolerating po/ No nausea or vomiting             Bleeding is moderate             Pain controlled with motrin             Up ad lib / ambulatory / voiding QS  Newborn breast feeding  O:               VS: BP 127/72 (BP Location: Left Arm)   Pulse 68   Temp 98.4 F (36.9 C) (Oral)   Resp 16   Ht  (1.803 m)   Wt 84.4 kg (186 lb)   SpO2 98%   Breastfeeding? Unknown   BMI 25.94 kg/m               Physical Exam:             Alert and oriented X3  Abdomen: soft, non-tender, non-distended              Fundus: firm, non-tender, Ueven  Perineum: ice pack in place  Lochia: moderate  Extremities: no edema, no calf pain or tenderness    A: PPD # 0 SVD with 2nd degree repair  Doing well - stable status  P: Routine post partum orders  DC tomorrow planned  Marlinda Mike CNM, MSN, [facility] 02/12/2017, 9:55 AM

## 2017-02-12 NOTE — Anesthesia Preprocedure Evaluation (Signed)
Anesthesia Evaluation  Patient identified by MRN, date of birth, ID band Patient awake    Reviewed: Allergy & Precautions, H&P , Patient's Chart, lab work & pertinent test results  Airway Mallampati: III  TM Distance: >3 FB Neck ROM: full    Dental no notable dental hx. (+) Teeth Intact   Pulmonary neg pulmonary ROS, former smoker,    Pulmonary exam normal breath sounds clear to auscultation       Cardiovascular negative cardio ROS Normal cardiovascular exam Rhythm:regular Rate:Normal     Neuro/Psych negative neurological ROS  negative psych ROS   GI/Hepatic negative GI ROS, Neg liver ROS,   Endo/Other  negative endocrine ROS  Renal/GU negative Renal ROS  negative genitourinary   Musculoskeletal   Abdominal   Peds  Hematology  (+) anemia ,   Anesthesia Other Findings   Reproductive/Obstetrics (+) Pregnancy                             Anesthesia Physical Anesthesia Plan  ASA: II  Anesthesia Plan: Epidural   Post-op Pain Management:    Induction:   Airway Management Planned:   Additional Equipment:   Intra-op Plan:   Post-operative Plan:   Informed Consent: I have reviewed the patients History and Physical, chart, labs and discussed the procedure including the risks, benefits and alternatives for the proposed anesthesia with the patient or authorized representative who has indicated his/her understanding and acceptance.     Plan Discussed with: Anesthesiologist  Anesthesia Plan Comments:         Anesthesia Quick Evaluation

## 2017-02-12 NOTE — Anesthesia Postprocedure Evaluation (Signed)
Anesthesia Post Note  Patient: Kaitlyn Conrad  Procedure(s) Performed: * No procedures listed *  Patient location during evaluation: Mother Baby Anesthesia Type: Epidural Level of consciousness: awake and alert and oriented Pain management: pain level controlled Vital Signs Assessment: post-procedure vital signs reviewed and stable Respiratory status: spontaneous breathing and nonlabored ventilation Cardiovascular status: stable Postop Assessment: no headache, patient able to bend at knees, no backache, no signs of nausea or vomiting, epidural receding and adequate PO intake Anesthetic complications: no        Last Vitals:  Vitals:   02/12/17 0331 02/12/17 0430  BP: 138/76 (!) (P) 142/82  Pulse: (!) 57 (P) 74  Resp: 18 (P) 16  Temp: 37.6 C (P) 37.4 C    Last Pain:  Vitals:   02/12/17 0430  TempSrc:   PainSc: 0-No pain   Pain Goal: Patients Stated Pain Goal: 0 (02/11/17 1957)               Laban Emperor

## 2017-02-12 NOTE — Progress Notes (Signed)
MD notified at 0111 of complete dilation/ +1 station/ 100% effacement and ruptured membranes with clear fluid. Dr. Billy Coast stated, "give her an hour to labor down." Called faculty for stand by.

## 2017-02-12 NOTE — Lactation Note (Signed)
This note was copied from a baby's chart. Lactation Consultation Note  Patient Name: Kaitlyn Conrad ZOXWR'U Date: 02/12/2017 Reason for consult: Initial assessment;Difficult latch   Initial consult with mom of 17 hour old infant. Infant STS with mom after bath in deep sleep. Mom reports she has been having trouble getting infant to latch. RN reports mom with large flat nipples. # 24 NS was initiated earlier today and mom reports that made it easier for infant to latch. Enc mom to keep infant STS as much as possible. Enc mom to feed infant STS 8-12 x in 24 hours at first feeding cues. Enc mom to call out for assitance as needed.   Mom has been able to express colostrum via hand expression. She reports she did not obtain colostrum with pump. Discussed what to expect with pumping and encouraged frequent hand expression to stimulate milk production. Mom was asking about giving formula. Discussed supply and demand, NB nutritional needs, and mom's good supply of colostrum, enc mom to hand express and give infant EBM. Mom is aware of how to supplement with curved tip syringe/finger.  Feeding log given with instructions for use. Breast milk handling and storage reviewed.   We discussed NS is a barrier and if using the NS for greater than 24 hours that pumping 4-6 x a day is recommended to protect milk supply. Mom has a pump at home that she ordered from insurance, she is unsure of brand. Mom is a [facility] client and is aware to call and make appt post d/c.   BF Resources handout and LC Brochure given, mom informed of IP/OP Services, BF Support Groups and LC phone #. Mom reports she dos not need assistance at this time. She is to call if assistance is needed.    Maternal Data Formula Feeding for Exclusion: No Has patient been taught Hand Expression?: Yes Does the patient have breastfeeding experience prior to this delivery?: No  Feeding Feeding Type: Breast Fed Length of feed: 2 min  LATCH  Score/Interventions Latch: Repeated attempts needed to sustain latch, nipple held in mouth throughout feeding, stimulation needed to elicit sucking reflex. Intervention(s): Skin to skin;Teach feeding cues;Waking techniques Intervention(s): Assist with latch  Audible Swallowing: Spontaneous and intermittent  Type of Nipple: Flat Intervention(s): Hand pump;Double electric pump (nipple shield 24)  Comfort (Breast/Nipple): Soft / non-tender     Hold (Positioning): Assistance needed to correctly position infant at breast and maintain latch. Intervention(s): Support Pillows;Position options;Skin to skin;Breastfeeding basics reviewed  LATCH Score: 7  Lactation Tools Discussed/Used WIC Program: Yes Pump Review: Setup, frequency, and cleaning;Milk Storage Initiated by:: Reviewed Date initiated:: 02/12/17   Consult Status Consult Status: Follow-up Date: 02/13/17 Follow-up type: In-patient    Silas Flood Emer Onnen 02/12/2017, 6:43 PM

## 2017-02-12 NOTE — Anesthesia Procedure Notes (Signed)
Epidural Patient location during procedure: OB Start time: 02/12/2017 12:24 AM  Staffing Anesthesiologist: Mal Amabile Performed: anesthesiologist   Preanesthetic Checklist Completed: patient identified, site marked, surgical consent, pre-op evaluation, timeout performed, IV checked, risks and benefits discussed and monitors and equipment checked  Epidural Patient position: sitting Prep: site prepped and draped and DuraPrep Patient monitoring: continuous pulse ox and blood pressure Approach: midline Location: L3-L4 Injection technique: LOR air  Needle:  Needle type: Tuohy  Needle gauge: 17 G Needle length: 9 cm and 9 Needle insertion depth: 5 cm cm Catheter type: closed end flexible Catheter size: 19 Gauge Catheter at skin depth: 10 cm Test dose: negative and Other  Assessment Events: blood not aspirated, injection not painful, no injection resistance, negative IV test and no paresthesia  Additional Notes Patient identified. Risks and benefits discussed including failed block, incomplete  Pain control, post dural puncture headache, nerve damage, paralysis, blood pressure Changes, nausea, vomiting, reactions to medications-both toxic and allergic and post Partum back pain. All questions were answered. Patient expressed understanding and wished to proceed. Sterile technique was used throughout procedure. Epidural site was Dressed with sterile barrier dressing. No paresthesias, signs of intravascular injection Or signs of intrathecal spread were encountered. Attempt x 2 due to mild scoliosis Patient was more comfortable after the epidural was dosed. Please see RN's note for documentation of vital signs and FHR which are stable.

## 2017-02-13 DIAGNOSIS — O9902 Anemia complicating childbirth: Secondary | ICD-10-CM | POA: Diagnosis present

## 2017-02-13 MED ORDER — MAGNESIUM OXIDE 400 (241.3 MG) MG PO TABS: 400.0000 mg | ORAL_TABLET | Freq: Every day | ORAL | Status: AC

## 2017-02-13 MED ORDER — POLYSACCHARIDE IRON COMPLEX 150 MG PO CAPS: 150.0000 mg | ORAL_CAPSULE | Freq: Every day | ORAL | 1 refills | Status: AC

## 2017-02-13 MED ORDER — IBUPROFEN 600 MG PO TABS: 600.0000 mg | ORAL_TABLET | Freq: Four times a day (QID) | ORAL | 0 refills | Status: AC

## 2017-02-13 MED ORDER — BENZOCAINE-MENTHOL 20-0.5 % EX AERO: 1.0000 "application " | INHALATION_SPRAY | CUTANEOUS | Status: AC | PRN

## 2017-02-13 MED ORDER — COCONUT OIL OIL: 1.0000 "application " | TOPICAL_OIL | 0 refills | Status: AC | PRN

## 2017-02-13 MED ORDER — OXYCODONE-ACETAMINOPHEN 5-325 MG PO TABS: 1.0000 | ORAL_TABLET | ORAL | 0 refills | Status: AC | PRN

## 2017-02-13 MED ORDER — POLYSACCHARIDE IRON COMPLEX 150 MG PO CAPS
150.0000 mg | ORAL_CAPSULE | Freq: Every day | ORAL | Status: DC
  Administered 2017-02-13: 150 mg via ORAL
  Filled 2017-02-13: qty 1

## 2017-02-13 MED ORDER — MAGNESIUM OXIDE 400 (241.3 MG) MG PO TABS
400.0000 mg | ORAL_TABLET | Freq: Every day | ORAL | Status: DC
  Administered 2017-02-13: 400 mg via ORAL
  Filled 2017-02-13 (×2): qty 1

## 2017-02-13 NOTE — Discharge Summary (Signed)
Obstetric Discharge Summary Reason for Admission: onset of labor Prenatal Procedures: ultrasound Intrapartum Procedures: spontaneous vaginal delivery and epidural Postpartum Procedures: none Complications-Operative and Postpartum: 2nd degree perineal laceration Hemoglobin  Date Value Ref Range Status  02/12/2017 8.9 (L) 12.0 - 15.0 g/dL Final   HCT  Date Value Ref Range Status  02/12/2017 27.3 (L) 36.0 - 46.0 % Final    Physical Exam:  General: alert, cooperative and no distress Lochia: appropriate Uterine Fundus: firm Incision: healing well DVT Evaluation: No cords or calf tenderness. No significant calf/ankle edema.  Discharge Diagnoses: Term Pregnancy-delivered, anemia of pregancy  Discharge Information: Date: 02/13/2017 Activity: pelvic rest Diet: routine Medications: PNV, Ibuprofen, Iron and Percocet Condition: stable Instructions: refer to practice specific booklet Discharge to: home Follow-up Information    MODY,VAISHALI R, MD. Schedule an appointment as soon as possible for a visit in 6 week(s).   Specialty:  Obstetrics and Gynecology Contact information: [address] [place] [address] [phone]           Newborn Data: Live born female Aleen Campi Birth Weight: 8 lb 10.8 oz (3935 g) APGAR: 8, 9  Home with mother. Circumcision planned outpatient - [facility]  Neta Mends, [place] 02/13/2017, 8:52 AM

## 2017-02-13 NOTE — Progress Notes (Signed)
MOB was referred for history of depression/anxiety. * Referral screened out by Clinical Social Worker because none of the following criteria appear to apply: ~ History of anxiety/depression during this pregnancy, or of post-partum depression. ~ Diagnosis of anxiety and/or depression within last 3 years OR * MOB's symptoms currently being treated with medication and/or therapy. MOB denied MH hx.  CSW provided MOB with PPD education.  Please contact the Clinical Social Worker if needs arise, or if MOB requests.  Alon Mazor Boyd-Gilyard, MSW, LCSW Clinical Social Work [phone]   

## 2017-02-13 NOTE — Progress Notes (Signed)
Mom has been pumping regularly and successfully, and at midnight was able to produce 15 mls of breastmilk.  Mom has not latched baby to breast often; he will latch but does not maintain it.  Mom is comfortable with pumping and bottling the baby and currently is leaning toward that plan.  jtwells, rn

## 2017-02-13 NOTE — Lactation Note (Signed)
This note was copied from a baby's chart. Lactation Consultation Note  Patient Name: Kaitlyn Conrad ZOXWR'U Date: 02/13/2017 Reason for consult: Follow-up assessment Baby at 34 hr of life. Upon entry baby was sleeping sts with mom. She stated she wants to offer breast and formula. She does not think she and baby have the same sleeping schedule so she wants the flexibility of others being able to feed the baby while she sleeps. She is concerned that baby may not get enough from the breast. She has DEBP set up and has used it once. She got drops when she pumped so she did not think it was worth her time to do it again. She stated it is hard to latch baby and offering bottles is easier. Encouraged her to keep practicing and if baby is not latching call for help. Suggested if someone else feeds baby she should take that time to pump and her over time she will see the pumped volume rise. She tried to latch baby but he was sleepy and tongue sucking. Applied #24 NS and baby latched easily and comfortably. When baby came off there was colostrum in the NS. Demonstrated manual expression, large drops of colostrum noted bilaterally, spoon at bedside. Discussed baby behavior, feeding frequency, baby belly size, voids, wt loss, breast changes, and nipple care. Mom is aware of lactation services and support group.      Maternal Data    Feeding Feeding Type: Breast Fed Nipple Type: Slow - flow Length of feed: 10 min  LATCH Score/Interventions Latch: Grasps breast easily, tongue down, lips flanged, rhythmical sucking. Intervention(s): Waking techniques;Skin to skin Intervention(s): Adjust position;Assist with latch  Audible Swallowing: Spontaneous and intermittent Intervention(s): Hand expression Intervention(s): Alternate breast massage  Type of Nipple: Flat  Comfort (Breast/Nipple): Soft / non-tender     Hold (Positioning): Full assist, staff holds infant at breast Intervention(s): Position  options;Support Pillows  LATCH Score: 7  Lactation Tools Discussed/Used Tools: Nipple Shields Nipple shield size: 24   Consult Status Consult Status: Follow-up Date: 02/14/17 Follow-up type: In-patient    [name] 02/13/2017, 11:48 AM

## 2017-02-13 NOTE — Discharge Instructions (Signed)
Follow up with [facility] within 1 week for newborn's circumcision.  Phone # [phone] Mon-Fri 10am - 3pm

## 2017-02-14 ENCOUNTER — Ambulatory Visit: Payer: Self-pay

## 2017-02-14 NOTE — Lactation Note (Addendum)
This note was copied from a baby's chart. Lactation Consultation Note; Mom has been bottle feeding EBM as available and formula through the night, Reports she pumped 4 times yesterday, Obtained about 1 oz at one pumping. Reports breasts are feeling some fuller this morning. Has Ameda pump for home. Is going back to [place] to be with family after delivery.Reviewed import ance of frequent pumping q 3 hours to promote a good milk supply. Reviewed our phone number, OP appointments and BFSG as resources for support after DC. To call prn Asking for assist with diaper change, burping and swaddling baby Reviewed with mom. No further questions  Patient Name: Kaitlyn Conrad ZOXWR'U Date: 02/14/2017 Reason for consult: Follow-up assessment   Maternal Data    Feeding Feeding Type: Bottle Fed - Breast Milk Nipple Type: Slow - flow  LATCH Score/Interventions                      Lactation Tools Discussed/Used WIC Program: No   Consult Status Consult Status: Complete    Pamelia Hoit 02/14/2017, 9:00 AM

## 2017-02-16 ENCOUNTER — Inpatient Hospital Stay (HOSPITAL_COMMUNITY): Admission: RE | Admit: 2017-02-16 | Payer: No Typology Code available for payment source | Source: Ambulatory Visit
